# Patient Record
Sex: Female | Born: 1975
Health system: Southern US, Community
[De-identification: ages and names within clinical notes are randomized; demographics above are authoritative.]

## PROBLEM LIST (undated history)

## (undated) DIAGNOSIS — E119 Type 2 diabetes mellitus without complications: Secondary | ICD-10-CM

---

## 2001-08-27 HISTORY — PX: BREAST BIOPSY: SHX20

## 2015-04-04 ENCOUNTER — Other Ambulatory Visit: Payer: Self-pay | Admitting: Unknown Physician Specialty

## 2015-04-04 DIAGNOSIS — N63 Unspecified lump in unspecified breast: Secondary | ICD-10-CM

## 2015-04-05 ENCOUNTER — Encounter (INDEPENDENT_AMBULATORY_CARE_PROVIDER_SITE_OTHER): Payer: Self-pay

## 2015-04-05 ENCOUNTER — Ambulatory Visit
Admission: RE | Admit: 2015-04-05 | Discharge: 2015-04-05 | Disposition: A | Discharge status: Home / Self Care | Payer: BLUE CROSS/BLUE SHIELD | Source: Ambulatory Visit | Attending: Unknown Physician Specialty | Admitting: Unknown Physician Specialty

## 2015-04-05 ENCOUNTER — Ambulatory Visit: Payer: BLUE CROSS/BLUE SHIELD

## 2015-04-05 DIAGNOSIS — N63 Unspecified lump in unspecified breast: Secondary | ICD-10-CM

## 2015-04-05 IMAGING — US US BREAST*R* LIMITED INC AXILLA
1 series · 3 of 3 positions shown · non-contrast
Comparison: None.

CLINICAL DATA: 39-year-old female with a palpable abnormality in
the upper slightly outer right breast. The patient reports a
palpable abnormality and associated area of pain in the right breast
after a bump to this side approximately 1 week prior. Patient has a
history of abscesses in the left breast.

EXAM:
DIGITAL DIAGNOSTIC BILATERAL MAMMOGRAM WITH 3D TOMOSYNTHESIS AND CAD
RIGHT BREAST ULTRASOUND

[Series 1: us breast*right* limited inc axilla · 0.11mm/px · 3 of 3 slices shown]
[im 1/3]
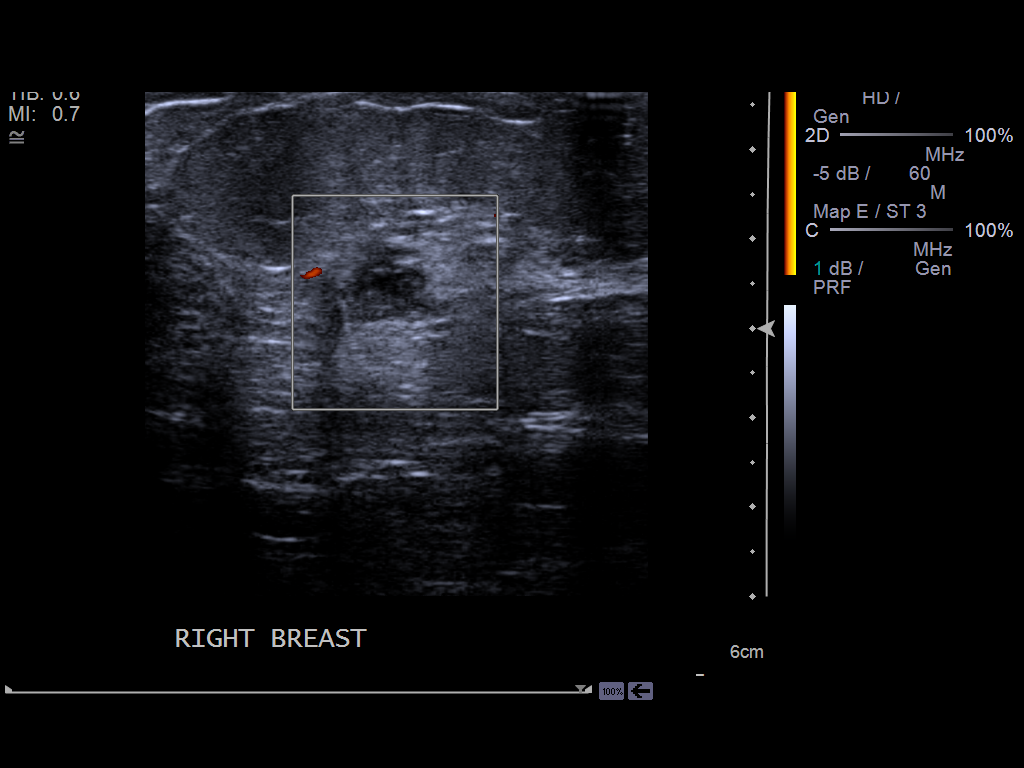
[im 2/3]
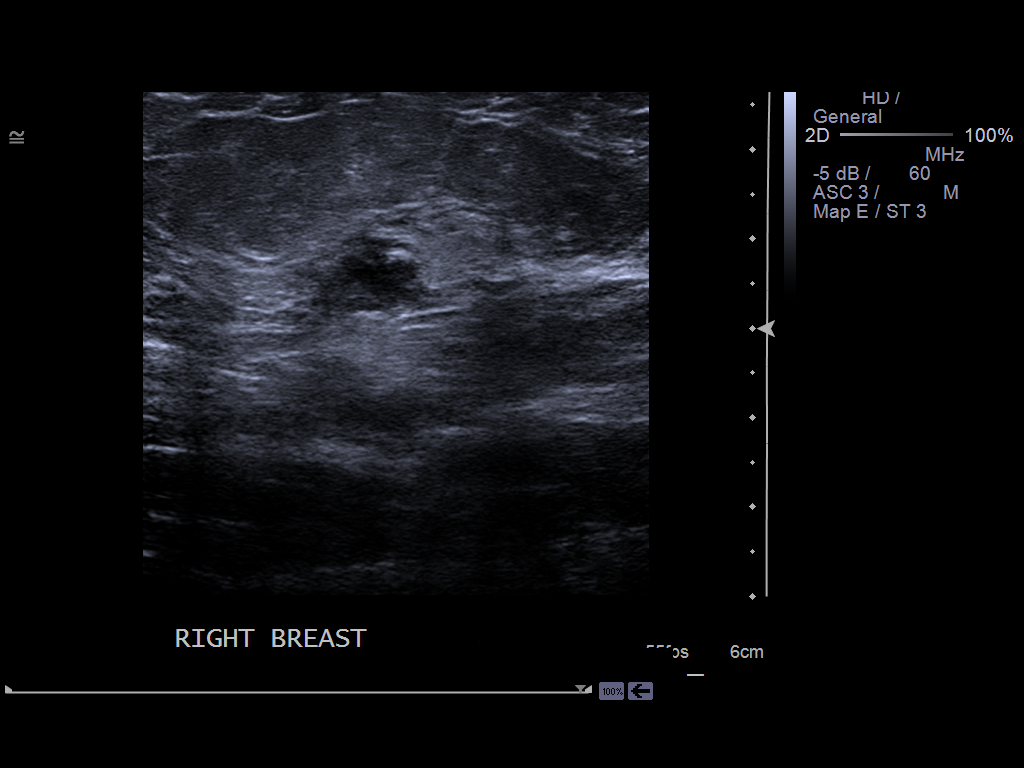
[im 3/3]
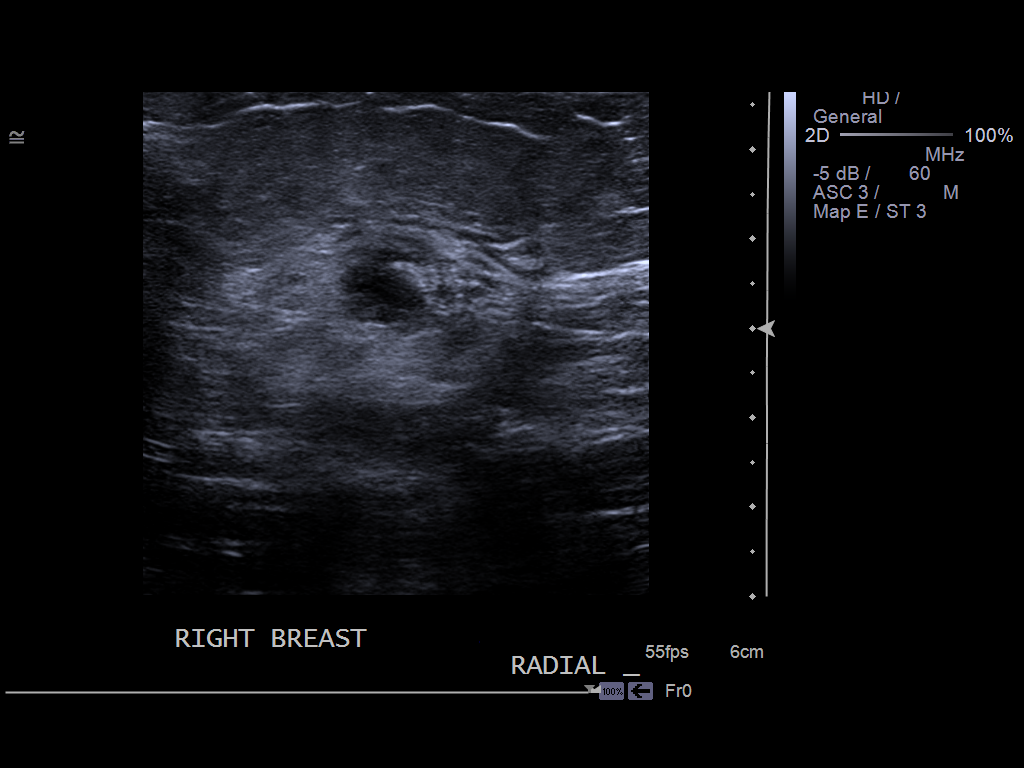

[3 of 3 positions shown; findings below may reference images not displayed]

ACR Breast Density Category c: The breast tissue is heterogeneously
dense, which may obscure small masses.
FINDINGS: There is an asymmetry measuring approximately 1.1 cm seen in the
slightly outer right breast on the right CC tomosynthesis
corresponding to the site of palpable concern. Note that
tomosynthesis was not performed of the bilateral breasts except for
the right CC view and spot compression tangential view due to the
patient's large breast size. No suspicious masses or calcifications
are seen in the left breast.

Mammographic images were processed with CAD.

Physical examination at site of palpable concern in the upper-outer
right breast reveals an area of thickening and tenderness at the
approximate 10 to 11 o'clock position.

Targeted ultrasound of the right breast was performed demonstrating
an irregular hypoechoic mass at 10 o'clock 8 cm from the nipple
measuring 1.2 x 0.9 x 1.2 cm. This corresponds with the area of
palpable concern and with mammography findings.

No lymphadenopathy seen in the right axilla.
IMPRESSION: 1.  Indeterminate mass in the upper-outer right breast.

2.  No mammographic evidence of malignancy in the left breast.

RECOMMENDATION:
Ultrasound-guided biopsy of the mass in the upper-outer right breast
is recommended. This will be scheduled at the patient's convenience.

I have discussed the findings and recommendations with the patient.
Results were also provided in writing at the conclusion of the
visit. If applicable, a reminder letter will be sent to the patient
regarding the next appointment.

BI-RADS CATEGORY  4: Suspicious.

## 2015-04-06 ENCOUNTER — Other Ambulatory Visit: Payer: Self-pay | Admitting: Unknown Physician Specialty

## 2015-04-06 DIAGNOSIS — R928 Other abnormal and inconclusive findings on diagnostic imaging of breast: Secondary | ICD-10-CM

## 2015-04-06 DIAGNOSIS — N63 Unspecified lump in unspecified breast: Secondary | ICD-10-CM

## 2015-04-13 ENCOUNTER — Ambulatory Visit
Admission: RE | Admit: 2015-04-13 | Discharge: 2015-04-13 | Disposition: A | Discharge status: Home / Self Care | Payer: BLUE CROSS/BLUE SHIELD | Source: Ambulatory Visit | Attending: Unknown Physician Specialty | Admitting: Unknown Physician Specialty

## 2015-04-13 DIAGNOSIS — N63 Unspecified lump in unspecified breast: Secondary | ICD-10-CM

## 2015-04-13 DIAGNOSIS — R928 Other abnormal and inconclusive findings on diagnostic imaging of breast: Secondary | ICD-10-CM

## 2015-04-13 DIAGNOSIS — M7989 Other specified soft tissue disorders: Secondary | ICD-10-CM | POA: Insufficient documentation

## 2015-04-13 HISTORY — PX: BREAST BIOPSY: SHX20

## 2015-04-14 LAB — SURGICAL PATHOLOGY

## 2016-04-09 ENCOUNTER — Ambulatory Visit: Payer: BLUE CROSS/BLUE SHIELD | Admitting: Dietician

## 2016-04-23 ENCOUNTER — Encounter: Payer: Self-pay | Admitting: Dietician

## 2016-04-23 NOTE — Progress Notes (Signed)
Patient missed her initial [facility] appointment on 04/09/16, and has not responded to reschedule. Sent letter to MD.

## 2017-02-01 ENCOUNTER — Other Ambulatory Visit: Payer: Self-pay | Admitting: Nurse Practitioner

## 2017-02-01 DIAGNOSIS — Z1239 Encounter for other screening for malignant neoplasm of breast: Secondary | ICD-10-CM

## 2017-03-01 ENCOUNTER — Ambulatory Visit
Admission: RE | Admit: 2017-03-01 | Discharge: 2017-03-01 | Disposition: A | Discharge status: Home / Self Care | Payer: BLUE CROSS/BLUE SHIELD | Source: Ambulatory Visit | Attending: Nurse Practitioner | Admitting: Nurse Practitioner

## 2017-03-01 DIAGNOSIS — Z1239 Encounter for other screening for malignant neoplasm of breast: Secondary | ICD-10-CM

## 2017-03-01 DIAGNOSIS — Z1231 Encounter for screening mammogram for malignant neoplasm of breast: Secondary | ICD-10-CM | POA: Diagnosis not present

## 2017-05-15 ENCOUNTER — Other Ambulatory Visit: Payer: Self-pay | Admitting: Internal Medicine

## 2017-05-15 DIAGNOSIS — R0789 Other chest pain: Secondary | ICD-10-CM

## 2017-05-28 ENCOUNTER — Other Ambulatory Visit: Payer: Self-pay | Admitting: Internal Medicine

## 2017-05-28 DIAGNOSIS — R0789 Other chest pain: Secondary | ICD-10-CM

## 2017-06-06 ENCOUNTER — Encounter
Admission: RE | Admit: 2017-06-06 | Discharge: 2017-06-06 | Disposition: A | Discharge status: Home / Self Care | Payer: BLUE CROSS/BLUE SHIELD | Source: Ambulatory Visit | Attending: Internal Medicine | Admitting: Internal Medicine

## 2017-06-06 DIAGNOSIS — R9439 Abnormal result of other cardiovascular function study: Secondary | ICD-10-CM | POA: Diagnosis not present

## 2017-06-06 DIAGNOSIS — R0789 Other chest pain: Secondary | ICD-10-CM | POA: Diagnosis not present

## 2017-06-06 HISTORY — DX: Type 2 diabetes mellitus without complications: E11.9

## 2017-06-06 MED ORDER — REGADENOSON 0.4 MG/5ML IV SOLN
0.4000 mg | Freq: Once | INTRAVENOUS | Status: AC
  Administered 2017-06-06: 0.4 mg via INTRAVENOUS
  Filled 2017-06-06: qty 5

## 2017-06-06 MED ORDER — TECHNETIUM TC 99M TETROFOSMIN IV KIT
30.0000 | PACK | Freq: Once | INTRAVENOUS | Status: AC | PRN
  Administered 2017-06-06: 32.982 via INTRAVENOUS

## 2017-06-07 ENCOUNTER — Ambulatory Visit
Admission: RE | Admit: 2017-06-07 | Discharge: 2017-06-07 | Disposition: A | Discharge status: Home / Self Care | Payer: BLUE CROSS/BLUE SHIELD | Source: Ambulatory Visit | Attending: Internal Medicine | Admitting: Internal Medicine

## 2017-06-07 ENCOUNTER — Encounter
Admission: RE | Admit: 2017-06-07 | Discharge: 2017-06-07 | Disposition: A | Discharge status: Home / Self Care | Payer: BLUE CROSS/BLUE SHIELD | Source: Ambulatory Visit | Attending: Internal Medicine | Admitting: Internal Medicine

## 2017-06-07 DIAGNOSIS — R0789 Other chest pain: Secondary | ICD-10-CM | POA: Insufficient documentation

## 2017-06-07 DIAGNOSIS — R9439 Abnormal result of other cardiovascular function study: Secondary | ICD-10-CM | POA: Insufficient documentation

## 2017-06-07 LAB — NM MYOCAR MULTI W/SPECT W/WALL MOTION / EF
CHL CUP MPHR: 179 {beats}/min
CHL CUP NUCLEAR SRS: 18
CHL CUP NUCLEAR SSS: 11
CHL CUP RESTING HR STRESS: 58 {beats}/min
CSEPED: 1 min
Estimated workload: 1 METS
Exercise duration (sec): 1 s
LV dias vol: 222 mL (ref 46–106)
LVSYSVOL: 111 mL
NUC STRESS TID: 1.31
Peak HR: 102 {beats}/min
Percent HR: 56 %
SDS: 3

## 2017-06-07 MED ORDER — TECHNETIUM TC 99M TETROFOSMIN IV KIT: 30.0000 | PACK | Freq: Once | INTRAVENOUS | Status: DC | PRN

## 2017-06-07 MED ORDER — TECHNETIUM TC 99M TETROFOSMIN IV KIT
30.0000 | PACK | Freq: Once | INTRAVENOUS | Status: AC | PRN
  Administered 2017-06-07: 29.6 via INTRAVENOUS

## 2017-06-07 NOTE — Progress Notes (Signed)
*  PRELIMINARY RESULTS* Echocardiogram 2D Echocardiogram has been performed.  Cristela Blue 06/07/2017, 11:20 AM
